# Patient Record
Sex: Male | Born: 1980 | Race: White | Hispanic: No | Marital: Single | Smoking: Never smoker
Health system: Southern US, Community
[De-identification: ages and names within clinical notes are randomized; demographics above are authoritative.]

---

## 2007-07-11 ENCOUNTER — Emergency Department (HOSPITAL_COMMUNITY): Admission: EM | Admit: 2007-07-11 | Discharge: 2007-07-11 | Payer: Self-pay | Admitting: Emergency Medicine

## 2007-11-12 ENCOUNTER — Emergency Department (HOSPITAL_COMMUNITY): Admission: EM | Admit: 2007-11-12 | Discharge: 2007-11-12 | Payer: Self-pay | Admitting: Emergency Medicine

## 2009-05-20 IMAGING — CR DG ABDOMEN ACUTE W/ 1V CHEST
4 series · 4 of 4 positions shown · non-contrast
Comparison: None

CLINICAL DATA: Abdominal pain.  Vomiting.  Constipation.  History
of asthma.

ACUTE ABDOMEN SERIES (ABDOMEN 2 VIEW & CHEST 1 VIEW)

[w abdomen upright]
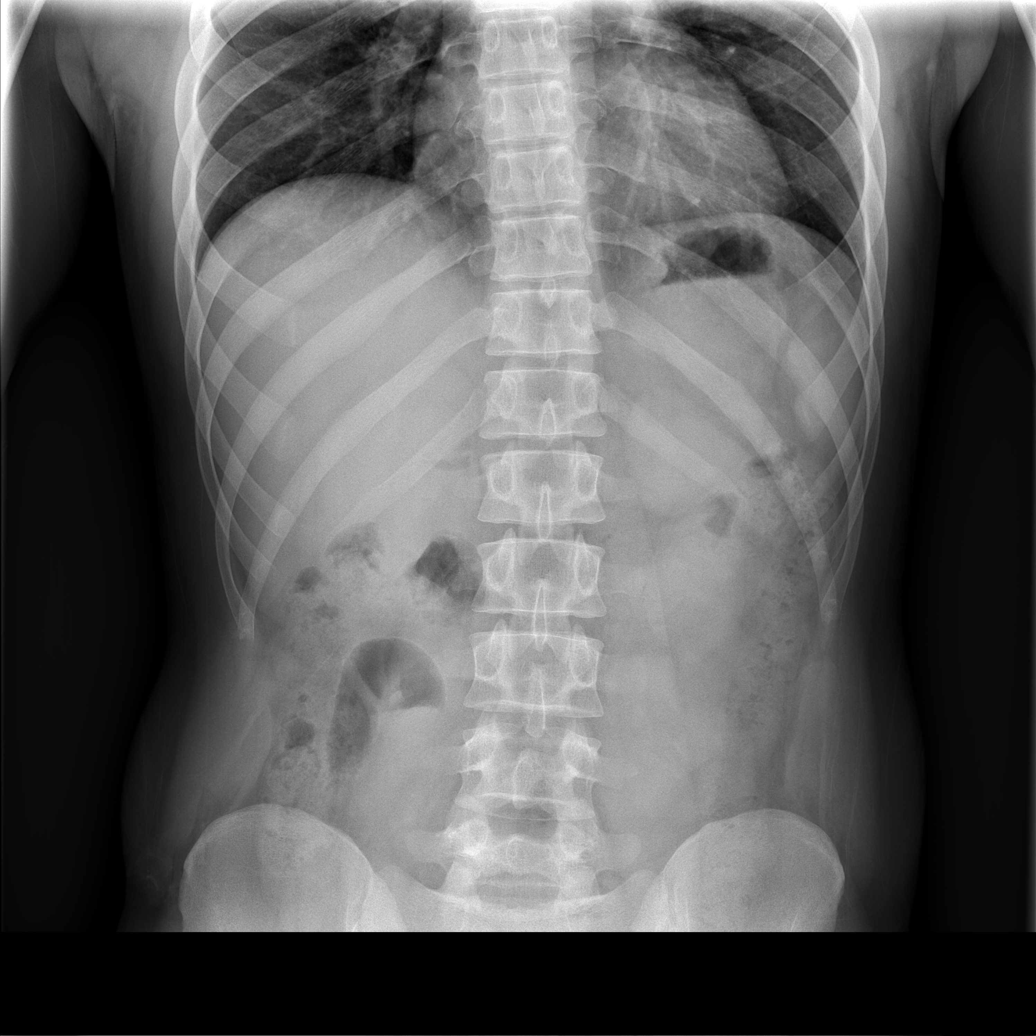

[w chest pa]
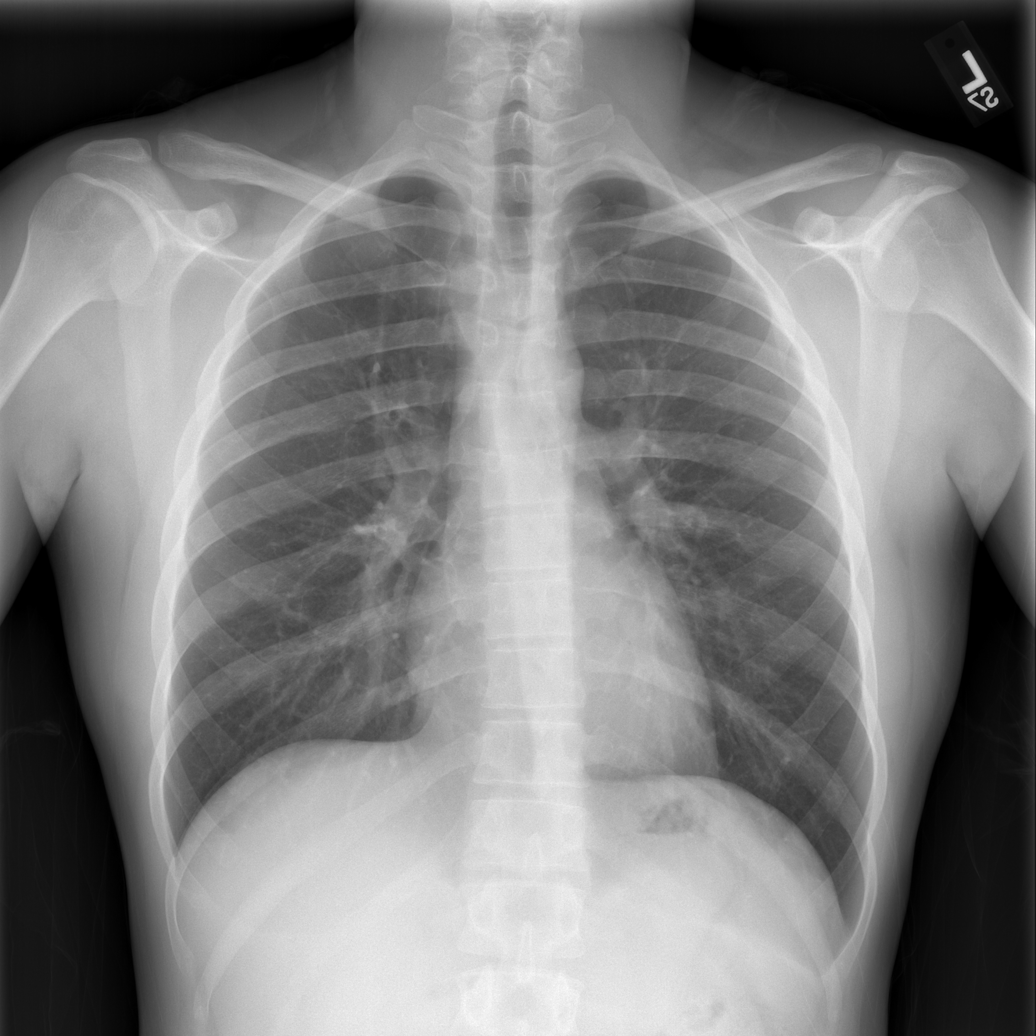

[t abdomen supine (1 of 2)]
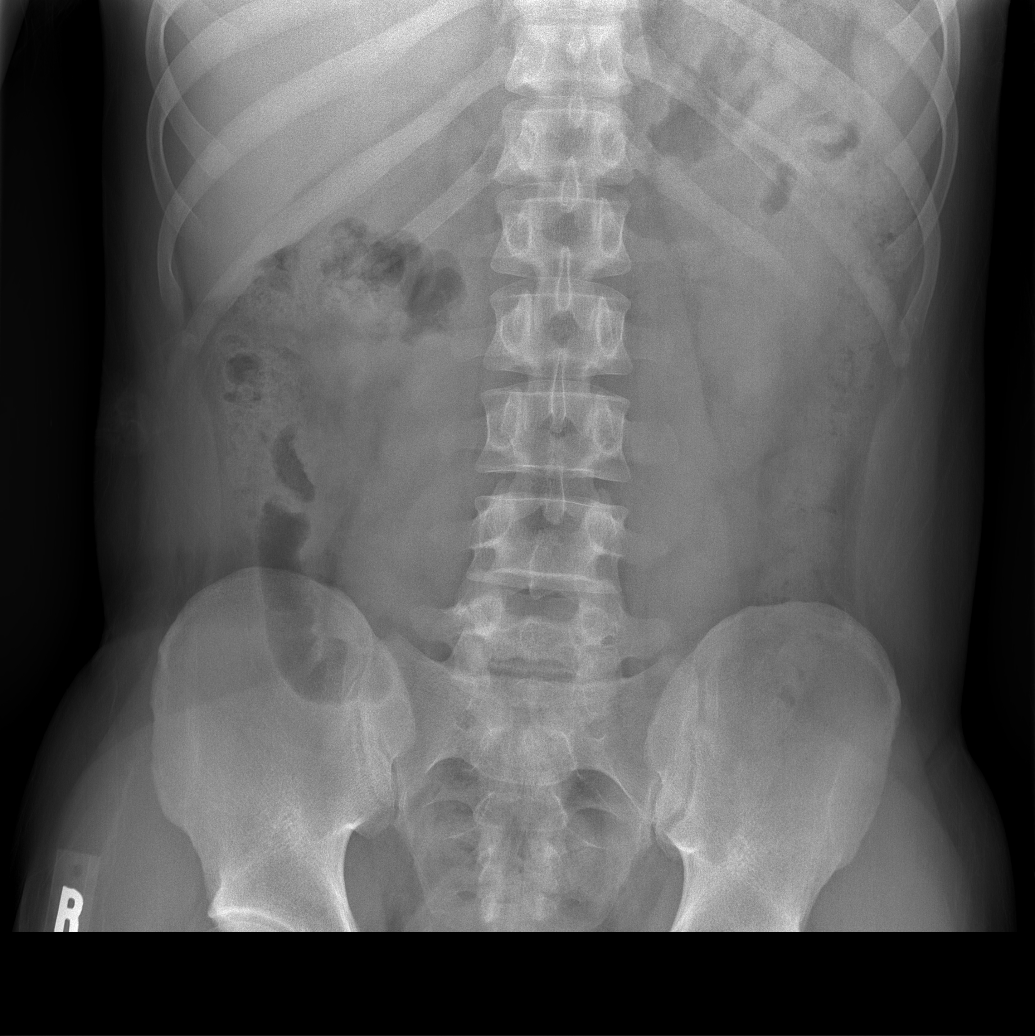

[t abdomen supine (2 of 2)]
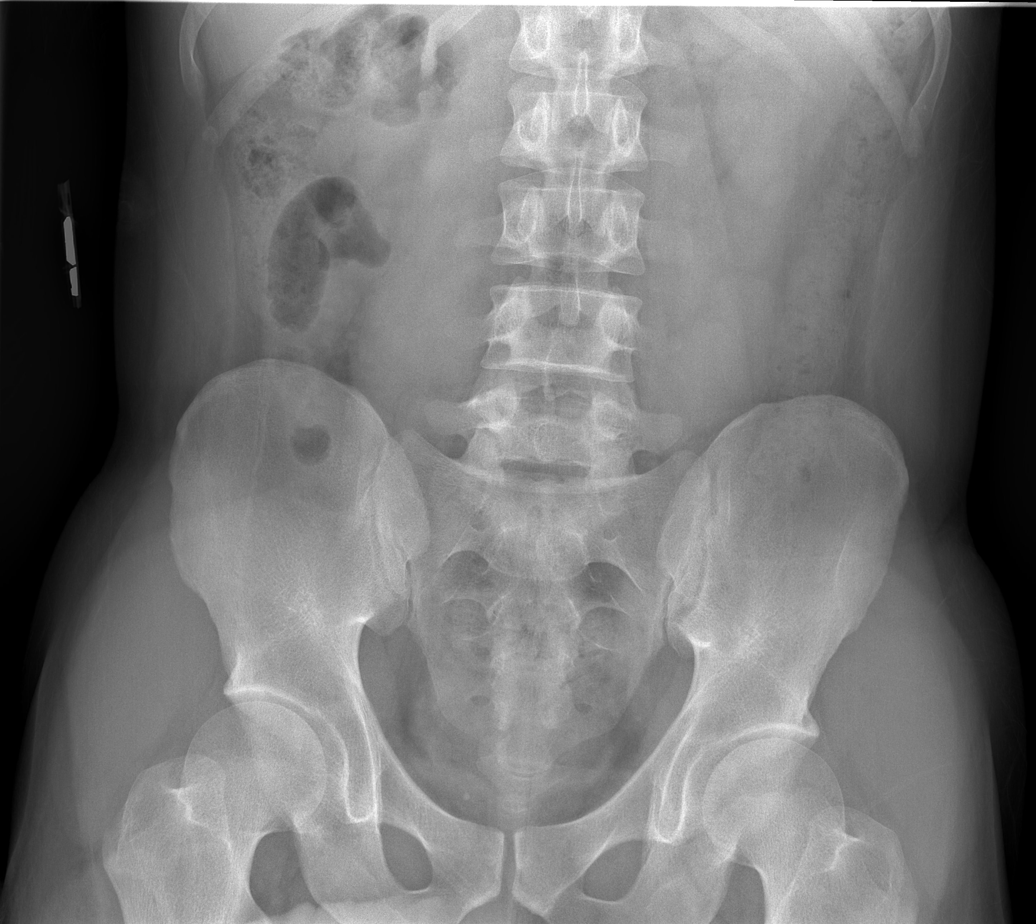

[4 of 4 positions shown; findings below may reference images not displayed]

FINDINGS: Frontal view of the chest demonstrates midline trachea.
Normal heart size and mediastinal contours. No pleural effusion or
pneumothorax. Clear lungs. 3 views of the abdomen demonstrate no
free intraperitoneal air.  No significant air fluid levels.  No
abnormal abdominal calcifications.  Probable phlebolith in the
right side pelvis.
IMPRESSION: 1. No acute cardiopulmonary disease.
2. No evidence of bowel obstruction or free intraperitoneal air.

## 2010-12-14 LAB — CBC
MCHC: 33.2
MCV: 89.4
Platelets: 211
RDW: 12.1
WBC: 13.4 — ABNORMAL HIGH

## 2010-12-14 LAB — COMPREHENSIVE METABOLIC PANEL
AST: 18
Albumin: 4.4
Calcium: 9.6
Creatinine, Ser: 1.03
GFR calc Af Amer: >60
Total Protein: 7.6

## 2010-12-14 LAB — DIFFERENTIAL
Eosinophils Relative: 1
Lymphocytes Relative: 17
Lymphs Abs: 2.3
Monocytes Absolute: 1

## 2016-10-25 ENCOUNTER — Ambulatory Visit: Payer: Self-pay | Admitting: Urology

## 2019-09-15 ENCOUNTER — Inpatient Hospital Stay: Admission: RE | Admit: 2019-09-15 | Payer: Self-pay | Source: Ambulatory Visit

## 2019-10-01 ENCOUNTER — Ambulatory Visit (INDEPENDENT_AMBULATORY_CARE_PROVIDER_SITE_OTHER): Payer: 59

## 2019-10-01 ENCOUNTER — Ambulatory Visit (HOSPITAL_COMMUNITY)
Admission: EM | Admit: 2019-10-01 | Discharge: 2019-10-01 | Disposition: A | Discharge status: Home / Self Care | Payer: 59 | Attending: Urgent Care | Admitting: Urgent Care

## 2019-10-01 ENCOUNTER — Encounter (HOSPITAL_COMMUNITY): Payer: Self-pay

## 2019-10-01 ENCOUNTER — Other Ambulatory Visit: Payer: Self-pay

## 2019-10-01 DIAGNOSIS — M533 Sacrococcygeal disorders, not elsewhere classified: Secondary | ICD-10-CM

## 2019-10-01 DIAGNOSIS — S322XXA Fracture of coccyx, initial encounter for closed fracture: Secondary | ICD-10-CM

## 2019-10-01 DIAGNOSIS — M545 Low back pain, unspecified: Secondary | ICD-10-CM

## 2019-10-01 DIAGNOSIS — W11XXXA Fall on and from ladder, initial encounter: Secondary | ICD-10-CM

## 2019-10-01 DIAGNOSIS — W19XXXA Unspecified fall, initial encounter: Secondary | ICD-10-CM

## 2019-10-01 MED ORDER — HYDROCODONE-ACETAMINOPHEN 5-325 MG PO TABS: 1.0000 | ORAL_TABLET | Freq: Four times a day (QID) | ORAL | 0 refills | Status: AC | PRN

## 2019-10-01 MED ORDER — KETOROLAC TROMETHAMINE 60 MG/2ML IM SOLN
60.0000 mg | Freq: Once | INTRAMUSCULAR | Status: AC
  Administered 2019-10-01: 60 mg via INTRAMUSCULAR

## 2019-10-01 MED ORDER — KETOROLAC TROMETHAMINE 60 MG/2ML IM SOLN
INTRAMUSCULAR | Status: AC
  Filled 2019-10-01: qty 2

## 2019-10-01 MED ORDER — NAPROXEN 500 MG PO TABS: 500.0000 mg | ORAL_TABLET | Freq: Two times a day (BID) | ORAL | 0 refills | Status: AC

## 2019-10-01 NOTE — ED Triage Notes (Signed)
Pt presents to UC after fall from ladder approx 8-10 feet this morning. Pt states initial impact was on feet, but then fell to bottom. Pt complaining of pain in tailbone, and up to middle back. Pt also complaining of numbness in tailbone.   Pt denies numbness or tingling in lower extremities, or new onset incontinence. Pt denies hitting head, or LOC.  Pt denies OTC treatment or relieving factors.

## 2019-10-01 NOTE — ED Provider Notes (Signed)
[facility]   MRN: [id number] DOB: [birth year]  Subjective:   Robert Bradford is a 39 y.o. male presenting for suffering an accidental fall today.  Larey Seat about 8 to 10 feet this morning.  States that he initially landed on his feet but ended up rolling backward toward the ground and made impact with his buttock area.  He has since had persistent and worsening moderate to severe low back pain but is worse over the tailbone.  Has not taken medications for pain.  No current facility-administered medications for this encounter. No current outpatient medications on file.   Not on File  History reviewed. No pertinent past medical history.   History reviewed. No pertinent surgical history.  History reviewed. No pertinent family history.  Social History   Tobacco Use  . Smoking status: Never Smoker  . Smokeless tobacco: Never Used  Substance Use Topics  . Alcohol use: Not on file  . Drug use: Not on file    ROS   Objective:   Vitals: BP 119/83 (BP Location: Right Arm)   Pulse 93   Temp 97.6 F (36.4 C) (Oral)   Resp 16   SpO2 99%   Physical Exam Constitutional:      General: He is not in acute distress.    Appearance: Normal appearance. He is well-developed and normal weight. He is not ill-appearing, toxic-appearing or diaphoretic.  HENT:     Head: Normocephalic and atraumatic.     Right Ear: External ear normal.     Left Ear: External ear normal.     Nose: Nose normal.     Mouth/Throat:     Pharynx: Oropharynx is clear.  Eyes:     General: No scleral icterus.       Right eye: No discharge.        Left eye: No discharge.     Extraocular Movements: Extraocular movements intact.     Pupils: Pupils are equal, round, and reactive to light.  Cardiovascular:     Rate and Rhythm: Normal rate.  Pulmonary:     Effort: Pulmonary effort is normal.  Musculoskeletal:     Cervical back: Normal range of motion.     Lumbar back: Spasms (Lumbar paraspinal muscles),  tenderness (Along midline, paraspinal muscles of the lumbar region and worst over the coccyx) and bony tenderness present. No swelling, edema, deformity, signs of trauma or lacerations. Normal range of motion. Negative right straight leg raise test and negative left straight leg raise test. No scoliosis.  Skin:    General: Skin is warm and dry.  Neurological:     Mental Status: He is alert and oriented to person, place, and time.     Motor: No weakness.     Coordination: Coordination normal.     Gait: Gait normal.     Deep Tendon Reflexes: Reflexes normal.  Psychiatric:        Mood and Affect: Mood normal.        Behavior: Behavior normal.        Thought Content: Thought content normal.        Judgment: Judgment normal.     DG Sacrum/Coccyx  Result Date: 10/01/2019 CLINICAL DATA:  Larey Seat 10 feet from a ladder today. Sacrococcygeal pain. EXAM: SACRUM AND COCCYX - 2+ VIEW COMPARISON:  None. FINDINGS: No evidence of sacral fracture. Suspicion of coccygeal fracture as marked by arrows on the lateral view. IMPRESSION: Suspicion of coccygeal fracture as marked on the lateral view. Electronically Signed  By: Paulina Fusi M.D.   On: 10/01/2019 17:37    Assessment and Plan :   PDMP not reviewed this encounter.  1. Coccyx pain   2. Acute bilateral low back pain without sciatica   3. Fall, initial encounter   4. Closed fracture of coccyx, initial encounter [facility])     Counseled patient on nature of the coccygeal fracture.  Will manage with scheduling naproxen, hydrocodone for breakthrough pain.  Discussed strict return to clinic and ER precautions.  Follow-up with Ortho.  Information provided to the patient. Counseled patient on potential for adverse effects with medications prescribed/recommended today, ER and return-to-clinic precautions discussed, patient verbalized understanding.    Wallis Bamberg, [place] 10/01/19 1821

## 2019-10-01 NOTE — Discharge Instructions (Signed)
For pain control start naproxen and take this twice daily with food.  Such we gave you Toradol in clinic please start naproxen tonight.  For breakthrough pain, pain not controlled by naproxen, take hydrocodone once every 6 hours.  Please make sure you contact an orthopedist and set up an appointment for follow-up.  If you develop worsening pain, pelvic fullness, weakness in your legs, hematuria, inability to defecate or lose control of your bowel movements or ability to urinate then please report to the emergency room.

## 2021-04-08 IMAGING — DX DG SACRUM/COCCYX 2+V
3 series · 3 of 3 positions shown · non-contrast
Comparison: None.

CLINICAL DATA: Fell 10 feet from a ladder today. Sacrococcygeal
pain.

EXAM:
SACRUM AND COCCYX - 2+ VIEW

[coccyx ap]
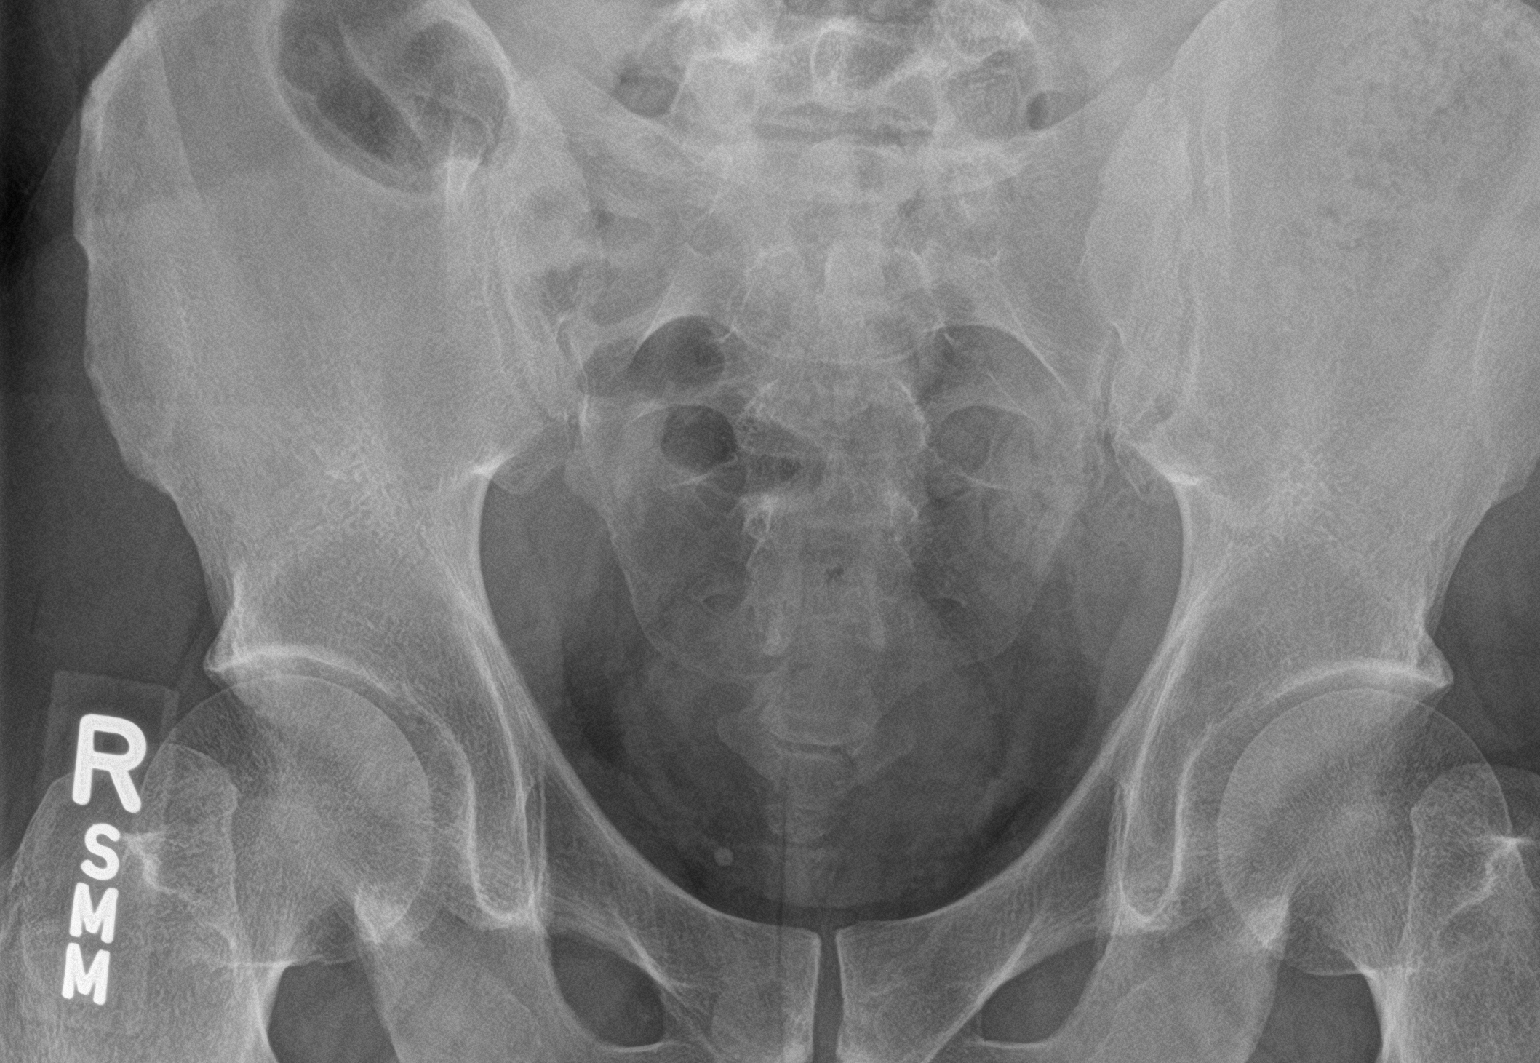

[sacrum ap]
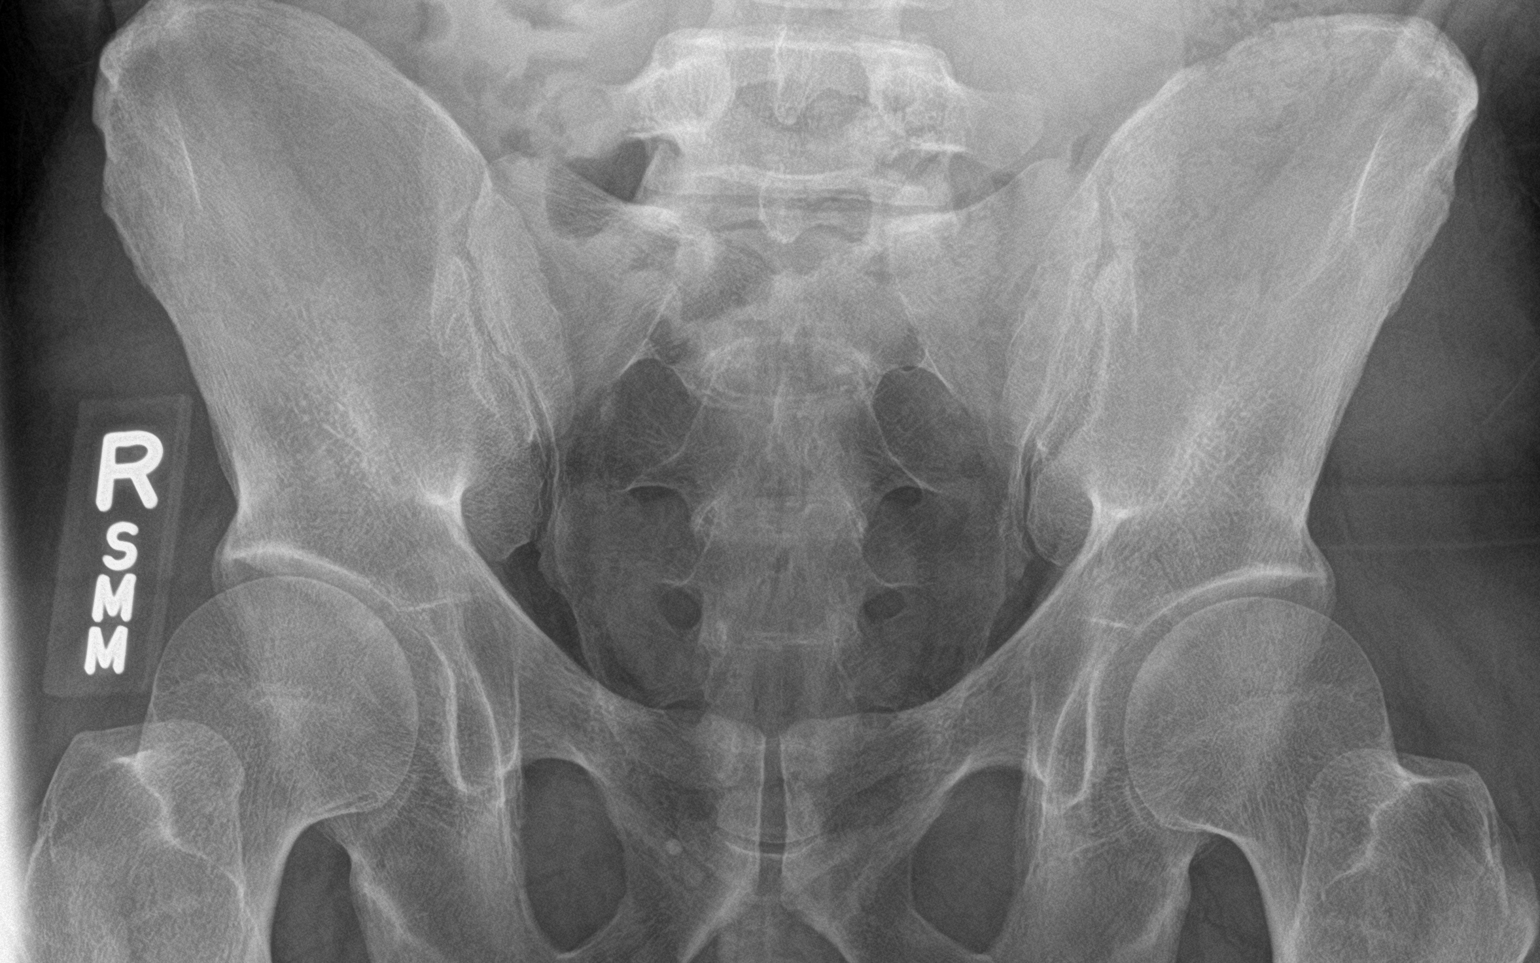

[sacrum lat]
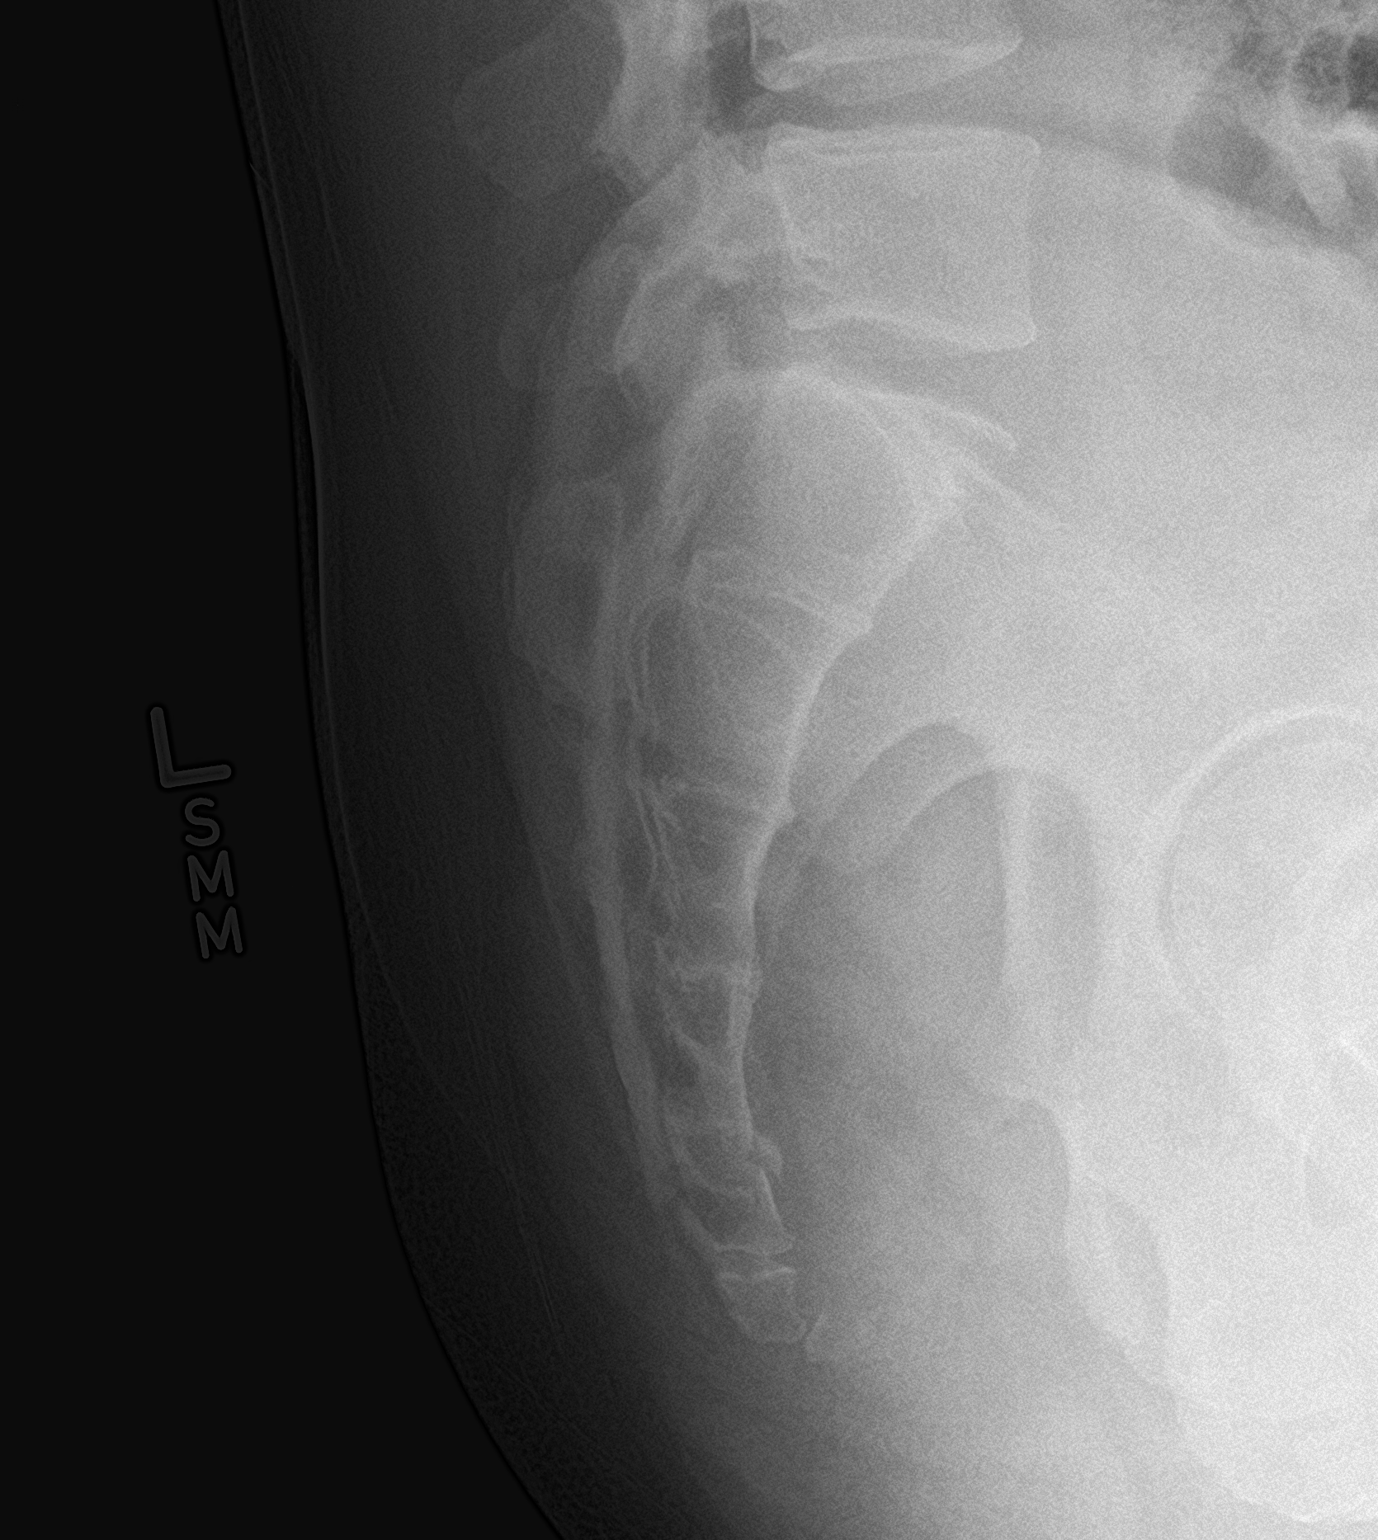

[3 of 3 positions shown; findings below may reference images not displayed]

FINDINGS: No evidence of sacral fracture. Suspicion of coccygeal fracture as
marked by arrows on the lateral view.
IMPRESSION: Suspicion of coccygeal fracture as marked on the lateral view.
